# Patient Record
Sex: Female | Born: 2000 | Race: Black or African American | Hispanic: No | Marital: Single | Smoking: Never smoker
Health system: Southern US, Community
[De-identification: ages and names within clinical notes are randomized; demographics above are authoritative.]

## PROBLEM LIST (undated history)

## (undated) DIAGNOSIS — J45909 Unspecified asthma, uncomplicated: Secondary | ICD-10-CM

## (undated) HISTORY — PX: NO PAST SURGERIES: SHX2092

---
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
## 2019-11-20 ENCOUNTER — Other Ambulatory Visit: Payer: Self-pay | Admitting: Family

## 2022-11-28 ENCOUNTER — Ambulatory Visit
Admission: EM | Admit: 2022-11-28 | Discharge: 2022-11-28 | Disposition: A | Discharge status: Home / Self Care | Payer: Medicaid Other | Attending: Internal Medicine | Admitting: Internal Medicine

## 2022-11-28 DIAGNOSIS — L720 Epidermal cyst: Secondary | ICD-10-CM

## 2022-11-28 MED ORDER — IBUPROFEN 800 MG PO TABS: 800.0000 mg | ORAL_TABLET | Freq: Three times a day (TID) | ORAL | 0 refills | Status: DC | PRN

## 2022-11-28 MED ORDER — DOXYCYCLINE HYCLATE 100 MG PO CAPS: 100.0000 mg | ORAL_CAPSULE | Freq: Two times a day (BID) | ORAL | 0 refills | Status: AC

## 2022-11-28 NOTE — ED Triage Notes (Signed)
Pt with bump on right side of face since about April-May of this year, larger in the last month and Last Friday pt was seen at ED in hospital dx epidermoid cyst booked appt with Dermatology today but the provider was not in and adv her to go to UC.  Denies: F/chills or drainage.

## 2022-11-28 NOTE — Discharge Instructions (Signed)
I recommend you follow up with dermatology or general surgery regarding removing the cyst. In the meantime, I have sent a prescription for ibuprofen 800mg  to help with the pain. I have also sent a prescription for doxycycline. This is an antibiotic used to treat skin infections. This will help with any developing infection. Please take as directed.

## 2022-11-28 NOTE — ED Provider Notes (Signed)
[facility]  Note:  This document was prepared using Dragon voice recognition software and may include unintentional dictation errors.  MRN: [id number] DOB: August 20, 2000 DATE: 11/28/22   Subjective:  Chief Complaint:  Chief Complaint  Patient presents with   Mass    HPI: Amy Kane is a 22 y.o. female presenting for mass on the right side of her face for the past 5 months.  Patient states she has had a mass on the right side of her face since April or May of this year, but has become larger within the last month.  She states she went to the ER on Friday and was diagnosed with an epidermoid cyst.  She was instructed to follow-up with dermatology to have the cyst removed.  She states she had an appointment today, but as she was waiting to be seen she was informed that the doctor had left for the day.  She is concerned because she feels that the area is getting more painful the more that it is palpated.  She states she has not take anything for the pain. Denies fever, nausea/vomiting, discharge. Endorses mass. Presents NAD.  Prior to Admission medications   Medication Sig Start Date End Date Taking? Authorizing Provider  doxycycline (VIBRAMYCIN) 100 MG capsule Take 1 capsule (100 mg total) by mouth 2 (two) times daily for 7 days. 11/28/22 12/05/22 Yes Terrius Gentile P, PA-C  ibuprofen (ADVIL) 800 MG tablet Take 1 tablet (800 mg total) by mouth every 8 (eight) hours as needed. 11/28/22  Yes Chiron Campione P, PA-C     No Known Allergies  History:   Past Medical History:  Diagnosis Date   Asthma      History reviewed. No pertinent surgical history.  History reviewed. No pertinent family history.  Social History   Tobacco Use   Smoking status: Never   Smokeless tobacco: Never    Review of Systems  Constitutional:  Negative for fever.  Gastrointestinal:  Negative for nausea and vomiting.  Skin:        Skin lesion/mass     Objective:   Vitals: BP 104/68 (BP  Location: Right Arm)   Pulse 78   Temp 98.7 F (37.1 C) (Oral)   Resp 18   LMP 11/05/2022 (Approximate)   SpO2 98%   Physical Exam Constitutional:      General: She is not in acute distress.    Appearance: Normal appearance. She is well-developed and normal weight. She is not ill-appearing or toxic-appearing.  HENT:     Head: Normocephalic and atraumatic. Mass present.     Comments: Patient has cyst approximately 2 cm in size on the right side of her face anterior to her right ear.  Tenderness to palpation.  No warmth, erythema, discharge.  No overlying erythema.    Left Ear: There is impacted cerumen.  Cardiovascular:     Rate and Rhythm: Normal rate and regular rhythm.     Heart sounds: Normal heart sounds.  Pulmonary:     Effort: Pulmonary effort is normal.     Breath sounds: Normal breath sounds.     Comments: Clear to auscultation bilaterally  Abdominal:     General: Bowel sounds are normal.     Palpations: Abdomen is soft.     Tenderness: There is no abdominal tenderness.  Skin:    General: Skin is warm and dry.     Findings: Lesion present.  Neurological:     General: No focal deficit present.  Mental Status: She is alert.  Psychiatric:        Mood and Affect: Mood and affect normal.     Results:  Labs: No results found for this or any previous visit (from the past 24 hour(s)).  Radiology: No results found.   UC Course/Treatments:  Procedures: Procedures   Medications Ordered in UC: Medications - No data to display   Assessment and Plan :     ICD-10-CM   1. Epidermoid cyst of face  L72.0      Epidermoid cyst of face Afebrile, nontoxic-appearing, NAD. VSS. DDX includes but not limited to: Cyst, abscess, parotitis, mumps Cyst is overlying right trigeminal nerve. Given location, I recommend a dermatologist or general surgeon remove the cyst. She has upcoming appointment with dermatology on 12/14/2022, but is unsure that she can wait that long. She  was provided with contact information for a general surgeon at her visit today.  In the meantime, she was given a prescription for ibuprofen 800mg  every 8 hours as needed for pain as well as a prescription for doxycycline 100 mg twice daily for possible developing cellulitis. Strict ED precautions were given and patient verbalized understanding.  ED Discharge Orders          Ordered    doxycycline (VIBRAMYCIN) 100 MG capsule  2 times daily        11/28/22 1644    ibuprofen (ADVIL) 800 MG tablet  Every 8 hours PRN        11/28/22 1644             PDMP not reviewed this encounter.       Cynda Ac[place], PA-C 11/28/22 1733

## 2023-08-20 ENCOUNTER — Ambulatory Visit
Admission: EM | Admit: 2023-08-20 | Discharge: 2023-08-20 | Disposition: A | Discharge status: Home / Self Care | Attending: Emergency Medicine | Admitting: Emergency Medicine

## 2023-08-20 ENCOUNTER — Other Ambulatory Visit: Payer: Self-pay

## 2023-08-20 DIAGNOSIS — K047 Periapical abscess without sinus: Secondary | ICD-10-CM | POA: Diagnosis not present

## 2023-08-20 DIAGNOSIS — S025XXB Fracture of tooth (traumatic), initial encounter for open fracture: Secondary | ICD-10-CM

## 2023-08-20 MED ORDER — IBUPROFEN 600 MG PO TABS: 600.0000 mg | ORAL_TABLET | Freq: Three times a day (TID) | ORAL | 0 refills | Status: DC | PRN

## 2023-08-20 MED ORDER — PENICILLIN V POTASSIUM 500 MG PO TABS: 500.0000 mg | ORAL_TABLET | Freq: Three times a day (TID) | ORAL | 0 refills | Status: AC

## 2023-08-20 NOTE — ED Provider Notes (Signed)
 [facility]    CSN: [id number] Arrival date & time: 08/20/23  1343    HISTORY   Chief Complaint  Patient presents with   Dental Pain   HPI Amy Kane is a pleasant, 22 y.o. female who presents to urgent care today. Patient complains of breaking a left upper molar while eating 2 days ago.  Patient states the tooth has been very painful and she has been avoiding chewing on that side.  Patient states she has also been gargling with hydrogen peroxide.  Patient states that today she noticed swelling and tenderness on the left side of her face near the broken tooth.  Patient states she has already scheduled an appointment with a dentist but wanted to be seen about the swelling in her face.  The history is provided by the patient.  Dental Pain  Past Medical History:  Diagnosis Date   Asthma    There are no active problems to display for this patient.  History reviewed. No pertinent surgical history. OB History   No obstetric history on file.    Home Medications    Prior to Admission medications   Medication Sig Start Date End Date Taking? Authorizing Provider  ibuprofen  (ADVIL ) 800 MG tablet Take 1 tablet (800 mg total) by mouth every 8 (eight) hours as needed. 11/28/22   Hermanns, Ashlee P, PA-C    Family History No family history on file. Social History Social History   Tobacco Use   Smoking status: Never   Smokeless tobacco: Never  Substance Use Topics   Alcohol use: Not Currently   Drug use: Yes    Types: Marijuana    Comment: daily   Allergies   Patient has no known allergies.  Review of Systems Review of Systems Pertinent findings revealed after performing a 14 point review of systems has been noted in the history of present illness.  Physical Exam Vital Signs BP 112/73 (BP Location: Right Arm)   Pulse 76   Temp 98.9 F (37.2 C) (Oral)   Resp 17   LMP 07/22/2023   SpO2 98%   No data found.  Physical Exam Vitals and nursing note  reviewed.  Constitutional:      General: She is awake. She is not in acute distress.    Appearance: Normal appearance. She is well-developed and well-groomed.  HENT:     Head:      Mouth/Throat:     Lips: Pink.     Mouth: Mucous membranes are moist.     Dentition: Gingival swelling present. No dental caries.     Tongue: No lesions. Tongue does not deviate from midline.     Palate: No lesions.   Neurological:     Mental Status: She is alert.  Psychiatric:        Behavior: Behavior is cooperative.     Visual Acuity Right Eye Distance:   Left Eye Distance:   Bilateral Distance:    Right Eye Near:   Left Eye Near:    Bilateral Near:     UC Couse / Diagnostics / Procedures:     Radiology No results found.  Procedures Procedures (including critical care time) EKG  Pending results:  Labs Reviewed - No data to display  Medications Ordered in UC: Medications - No data to display  UC Diagnoses / Final Clinical Impressions(s)   I have reviewed the triage vital signs and the nursing notes.  Pertinent labs & imaging results that were available during my care of  the patient were reviewed by me and considered in my medical decision making (see chart for details).    Final diagnoses:  Open fracture of tooth, initial encounter  Dental abscess   Patient advised to begin penicillin for treatment of presumed dental abscess.  Patient provided with ibuprofen  for pain and swelling.  Patient encouraged to keep her dental appointment and to return for further evaluation if penicillin and ibuprofen  have not been effective and symptoms are getting worse, not better.  Please see discharge instructions below for details of plan of care as provided to patient. ED Prescriptions     Medication Sig Dispense Auth. Provider   penicillin v potassium (VEETID) 500 MG tablet Take 1 tablet (500 mg total) by mouth 3 (three) times daily for 14 days. 42 tablet Eloise Hake Scales, PA-C    ibuprofen  (ADVIL ) 600 MG tablet Take 1 tablet (600 mg total) by mouth every 8 (eight) hours as needed for up to 30 doses for fever, headache, mild pain (pain score 1-3) or moderate pain (pain score 4-6) (Inflammation). Take 1 tablet 3 times daily as needed for inflammation of upper airways and/or pain. 30 tablet Eloise Hake Scales, PA-C      PDMP not reviewed this encounter.  Pending results:  Labs Reviewed - No data to display    Discharge Instructions      Please read below to learn more about the medications, dosages and frequencies that I recommend to help alleviate your symptoms and to get you feeling better soon:   Penicillin V:  Please take one (1) dose three times daily for 14 days.  This antibiotic can cause upset stomach, this will resolve once antibiotics are complete.  You are welcome to take a probiotic, eat yogurt, take Imodium while taking this medication.  Please avoid other systemic medications such as Maalox, Pepto-Bismol or milk of magnesia as they can interfere with the body's ability to absorb the antibiotics.   Advil , Motrin  (ibuprofen ): This is a good anti-inflammatory medication which addresses aches, pains and inflammation of the upper airways that causes sinus and nasal congestion as well as in the lower airways which makes your cough feel tight and sometimes burn.  I recommend that you take between 400 to 600 mg every 6-8 hours as needed.  Please do not take more than 2400 mg of ibuprofen  in a 24-hour period and please do not take high doses of ibuprofen  for more than 3 days in a row as this can lead to stomach ulcers.   Please be sure that you keep your appointment with your dentist.  The tooth that is broken needs to be repaired so that it will not become infected again.    If symptoms have worsened in the next 5 to 7 days, please go to the emergency room for further evaluation.    Thank you for visiting urgent care today.  We appreciate the opportunity to  participate in your care.     Disposition Upon Discharge:  Condition: stable for discharge home  Patient presented with an acute illness with associated systemic symptoms and significant discomfort requiring urgent management. In my opinion, this is a condition that a prudent lay person (someone who possesses an average knowledge of health and medicine) may potentially expect to result in complications if not addressed urgently such as respiratory distress, impairment of bodily function or dysfunction of bodily organs.   Routine symptom specific, illness specific and/or disease specific instructions were discussed with the patient and/or caregiver  at length.   As such, the patient has been evaluated and assessed, work-up was performed and treatment was provided in alignment with urgent care protocols and evidence based medicine.  Patient/parent/caregiver has been advised that the patient may require follow up for further testing and treatment if the symptoms continue in spite of treatment, as clinically indicated and appropriate.  Patient/parent/caregiver has been advised to return to the [facility] or PCP if no better; to PCP or the Emergency Department if new signs and symptoms develop, or if the current signs or symptoms continue to change or worsen for further workup, evaluation and treatment as clinically indicated and appropriate  The patient will follow up with their current PCP if and as advised. If the patient does not currently have a PCP we will assist them in obtaining one.   The patient may need specialty follow up if the symptoms continue, in spite of conservative treatment and management, for further workup, evaluation, consultation and treatment as clinically indicated and appropriate.  Patient/parent/caregiver verbalized understanding and agreement of plan as discussed.  All questions were addressed during visit.  Please see discharge instructions below for further details of  plan.  This office note has been dictated using Teaching laboratory technician.  Unfortunately, this method of dictation can sometimes lead to typographical or grammatical errors.  I apologize for your inconvenience in advance if this occurs.  Please do not hesitate to reach out to me if clarification is needed.      Eloise Hake Scales, PA-C 08/20/23 1434

## 2023-08-20 NOTE — ED Triage Notes (Signed)
 Patient states pain left upper back tooth. Patient states she broke a tooth while eating 2 days ago. Patient states swelling to left side of face.

## 2023-08-20 NOTE — Discharge Instructions (Signed)
 Please read below to learn more about the medications, dosages and frequencies that I recommend to help alleviate your symptoms and to get you feeling better soon:   Penicillin V:  Please take one (1) dose three times daily for 14 days.  This antibiotic can cause upset stomach, this will resolve once antibiotics are complete.  You are welcome to take a probiotic, eat yogurt, take Imodium while taking this medication.  Please avoid other systemic medications such as Maalox, Pepto-Bismol or milk of magnesia as they can interfere with the body's ability to absorb the antibiotics.   Advil , Motrin  (ibuprofen ): This is a good anti-inflammatory medication which addresses aches, pains and inflammation of the upper airways that causes sinus and nasal congestion as well as in the lower airways which makes your cough feel tight and sometimes burn.  I recommend that you take between 400 to 600 mg every 6-8 hours as needed.  Please do not take more than 2400 mg of ibuprofen  in a 24-hour period and please do not take high doses of ibuprofen  for more than 3 days in a row as this can lead to stomach ulcers.   Please be sure that you keep your appointment with your dentist.  The tooth that is broken needs to be repaired so that it will not become infected again.    If symptoms have worsened in the next 5 to 7 days, please go to the emergency room for further evaluation.    Thank you for visiting urgent care today.  We appreciate the opportunity to participate in your care.

## 2024-01-29 ENCOUNTER — Ambulatory Visit: Admission: EM | Admit: 2024-01-29 | Discharge: 2024-01-29 | Disposition: A | Discharge status: Home / Self Care

## 2024-01-29 DIAGNOSIS — H6123 Impacted cerumen, bilateral: Secondary | ICD-10-CM | POA: Diagnosis not present

## 2024-01-29 DIAGNOSIS — H6012 Cellulitis of left external ear: Secondary | ICD-10-CM | POA: Diagnosis not present

## 2024-01-29 MED ORDER — CEFDINIR 300 MG PO CAPS: 300.0000 mg | ORAL_CAPSULE | Freq: Two times a day (BID) | ORAL | 0 refills | Status: AC

## 2024-01-29 MED ORDER — DEBROX 6.5 % OT SOLN: 5.0000 [drp] | Freq: Two times a day (BID) | OTIC | 0 refills | Status: AC

## 2024-01-29 NOTE — ED Triage Notes (Signed)
 Patient states about two days ago she started having left otalgia. Today she has been having muffled hearing. She states today she has had a slight headache and numbness in the surrounding area of her ear. She took Ibuprofen  yesterday and this morning which did help.

## 2024-01-29 NOTE — Discharge Instructions (Signed)
 For treatment of infection in the walls of the canal of your left ear, please begin taking cefdinir.  For management of the wax buildup in both of your ear canals, I would like for you to instill 5 drops of Debrox in each ear twice daily.  In the next 5 to 7 days, please return to have your ears irrigated so that the wax can be removed and we can fully evaluate your left ear canal and eardrum.  You are welcome to continue taking ibuprofen  as needed for pain.  Please do not exceed 2400 mg of ibuprofen  in a 24-hour period.  Thank you for visiting [facility] today.

## 2024-01-29 NOTE — ED Provider Notes (Signed)
 [facility]    CSN: [id number] Arrival date & time: 01/29/24  1641    HISTORY   Chief Complaint  Patient presents with   Otalgia   HPI Amy Kane is a pleasant, 23 y.o. female who presents to urgent care today. Patient complains of a 2-day history of gradually worsening left ear pain.  Patient states today, she noticed that her hearing was muffled.  Patient also states that she began to have a slight headache and numbness around her left ear as well.  States she took ibuprofen  yesterday and again this morning, states this relieved pain temporarily.  Patient reports a history of always having a lot of earwax.  Patient states she tries not to use Q-tips to clean out her ears but did try yesterday.  Patient states she also washed her hair 2 days ago and believes that she got water in her ear while doing so.  Patient denies nasal congestion, sinus pain or pressure, rhinorrhea, fever, body aches, chills, sore throat.  The history is provided by the patient.  Otalgia  Past Medical History:  Diagnosis Date   Asthma    Patient Active Problem List   Diagnosis Date Noted   Asthma 05/20/2013   Past Surgical History:  Procedure Laterality Date   NO PAST SURGERIES     OB History     Gravida  1   Para      Term      Preterm      AB  1   Living         SAB  1   IAB      Ectopic      Multiple      Live Births             Home Medications    Prior to Admission medications   Medication Sig Start Date End Date Taking? Authorizing Provider  albuterol (VENTOLIN HFA) 108 (90 Base) MCG/ACT inhaler Inhale 2 puffs into the lungs. 12/25/19  Yes [provider]  doxycycline  (VIBRAMYCIN ) 100 MG capsule Take 100 mg by mouth. 11/28/22  Yes [provider]  medroxyPROGESTERone (DEPO-PROVERA) 150 MG/ML injection Inject 150 mg into the muscle. 01/04/20  Yes [provider]  neomycin-polymyxin b-dexamethasone (MAXITROL) 3.5-10000-0.1 SUSP  SMARTSIG:In Eye(s) 01/28/24  Yes [provider]  ibuprofen  (ADVIL ) 600 MG tablet Take 1 tablet (600 mg total) by mouth every 8 (eight) hours as needed for up to 30 doses for fever, headache, mild pain (pain score 1-3) or moderate pain (pain score 4-6) (Inflammation). Take 1 tablet 3 times daily as needed for inflammation of upper airways and/or pain. 08/20/23   Joesph Shaver Scales, PA-C    Family History History reviewed. No pertinent family history. Social History Social History   Tobacco Use   Smoking status: Never    Passive exposure: Past   Smokeless tobacco: Never  Vaping Use   Vaping status: Never Used  Substance Use Topics   Alcohol use: Not Currently   Drug use: Yes    Types: Marijuana    Comment: daily   Allergies   Patient has no known allergies.  Review of Systems Review of Systems  HENT:  Positive for ear pain.    Pertinent findings revealed after performing a 14 point review of systems has been noted in the history of present illness.  Physical Exam Vital Signs BP 112/72 (BP Location: Right Arm)   Pulse 84   Temp 98.9 F (37.2 C) (Oral)  Resp 18   LMP 01/19/2024   SpO2 98%   No data found.  Physical Exam Vitals and nursing note reviewed.  Constitutional:      General: She is awake. She is not in acute distress.    Appearance: Normal appearance. She is well-developed and well-groomed. She is not ill-appearing.  HENT:     Head: Normocephalic and atraumatic.     Salivary Glands: Right salivary gland is not diffusely enlarged or tender. Left salivary gland is not diffusely enlarged or tender.     Right Ear: Hearing and external ear normal. No decreased hearing noted. There is impacted cerumen. No mastoid tenderness.     Left Ear: External ear normal. Decreased hearing noted. There is impacted cerumen. No mastoid tenderness.     Ears:      Comments: Antrum of left EAC is mildly erythematous and edematous    Nose: Nose normal. No rhinorrhea.      Right Turbinates: Enlarged, swollen and pale.     Left Turbinates: Enlarged, swollen and pale.     Right Sinus: No maxillary sinus tenderness or frontal sinus tenderness.     Left Sinus: No maxillary sinus tenderness or frontal sinus tenderness.     Mouth/Throat:     Lips: Pink. No lesions.     Mouth: Mucous membranes are moist. No oral lesions.     Tongue: No lesions. Tongue does not deviate from midline.     Palate: No mass and lesions.     Pharynx: Oropharynx is clear. Uvula midline. No pharyngeal swelling, oropharyngeal exudate, posterior oropharyngeal erythema, uvula swelling or postnasal drip.     Tonsils: No tonsillar exudate. 0 on the right. 0 on the left.  Eyes:     General: Lids are normal.        Right eye: No discharge.        Left eye: No discharge.     Conjunctiva/sclera: Conjunctivae normal.     Right eye: Right conjunctiva is not injected.     Left eye: Left conjunctiva is not injected.  Neck:     Trachea: Trachea and phonation normal.  Cardiovascular:     Rate and Rhythm: Normal rate and regular rhythm.  Pulmonary:     Effort: Pulmonary effort is normal.     Breath sounds: Normal breath sounds.  Chest:     Chest wall: No tenderness.  Musculoskeletal:        General: Normal range of motion.     Cervical back: Full passive range of motion without pain, normal range of motion and neck supple. Normal range of motion.  Lymphadenopathy:     Cervical: No cervical adenopathy.  Skin:    General: Skin is warm and dry.     Findings: No erythema or rash.  Neurological:     General: No focal deficit present.     Mental Status: She is alert and oriented to person, place, and time. Mental status is at baseline.  Psychiatric:        Attention and Perception: Attention and perception normal.        Mood and Affect: Mood and affect normal.        Speech: Speech normal.        Behavior: Behavior normal. Behavior is cooperative.        Thought Content: Thought content  normal.     Visual Acuity Right Eye Distance:   Left Eye Distance:   Bilateral Distance:    Right Eye Near:  Left Eye Near:    Bilateral Near:     UC Couse / Diagnostics / Procedures:     Radiology No results found.  Procedures Ear Cerumen Removal  Date/Time: 01/29/2024 5:11 PM  Performed by: Joesph Shaver Scales, PA-C Authorized by: Joesph Shaver Scales, PA-C   Consent:    Consent obtained:  Verbal   Consent given by:  Patient   Risks, benefits, and alternatives were discussed: yes     Risks discussed:  Dizziness, bleeding, infection, incomplete removal, TM perforation and pain   Alternatives discussed:  No treatment, delayed treatment, alternative treatment, observation and referral Universal protocol:    Procedure explained and questions answered to patient or proxy's satisfaction: yes     Patient identity confirmed:  Verbally with patient and arm band Procedure details:    Location:  R ear   Procedure type: irrigation     Procedure outcomes: unable to remove cerumen   Post-procedure details:    Procedure completion:  Procedure terminated at patient's request (Pt terminated irrigation of right EAC then politely refused irrigation of left EAC)  (including critical care time) EKG  Pending results:  Labs Reviewed - No data to display  Medications Ordered in UC: Medications - No data to display  UC Diagnoses / Final Clinical Impressions(s)   I have reviewed the triage vital signs and the nursing notes.  Pertinent labs & imaging results that were available during my care of the patient were reviewed by me and considered in my medical decision making (see chart for details).    Final diagnoses:  Bilateral impacted cerumen  Cellulitis of left ear canal   Patient was unable to tolerate irrigation of either ear.  Based on limited physical and findings, the best course of action for now is to treat patient empirically for presumed cellulitis in left EAC while  patient uses Debrox in both ears twice daily to soften earwax and make it more amenable to removal.  Patient encouraged to return in 5 to 7 days for ear irrigation so that a more thorough evaluation of both ears can be performed.  Patient advised to return sooner if no improvement.  Please see discharge instructions below for details of plan of care as provided to patient. ED Prescriptions     Medication Sig Dispense Auth. Provider   carbamide peroxide (DEBROX) 6.5 % OTIC solution Place 5 drops into both ears 2 (two) times daily for 7 days. 3.5 mL Joesph Shaver Scales, PA-C   cefdinir (OMNICEF) 300 MG capsule Take 1 capsule (300 mg total) by mouth 2 (two) times daily for 10 days. 20 capsule Joesph Shaver Scales, PA-C      PDMP not reviewed this encounter.  Pending results:  Labs Reviewed - No data to display    Discharge Instructions      For treatment of infection in the walls of the canal of your left ear, please begin taking cefdinir.  For management of the wax buildup in both of your ear canals, I would like for you to instill 5 drops of Debrox in each ear twice daily.  In the next 5 to 7 days, please return to have your ears irrigated so that the wax can be removed and we can fully evaluate your left ear canal and eardrum.  You are welcome to continue taking ibuprofen  as needed for pain.  Please do not exceed 2400 mg of ibuprofen  in a 24-hour period.  Thank you for visiting [facility] today.  Disposition Upon Discharge:  Condition: stable for discharge home  Patient presented with an acute illness with associated systemic symptoms and significant discomfort requiring urgent management. In my opinion, this is a condition that a prudent lay person (someone who possesses an average knowledge of health and medicine) may potentially expect to result in complications if not addressed urgently such as respiratory distress, impairment of bodily function or  dysfunction of bodily organs.   Routine symptom specific, illness specific and/or disease specific instructions were discussed with the patient and/or caregiver at length.   As such, the patient has been evaluated and assessed, work-up was performed and treatment was provided in alignment with urgent care protocols and evidence based medicine.  Patient/parent/caregiver has been advised that the patient may require follow up for further testing and treatment if the symptoms continue in spite of treatment, as clinically indicated and appropriate.  Patient/parent/caregiver has been advised to return to the [facility] or PCP if no better; to PCP or the Emergency Department if new signs and symptoms develop, or if the current signs or symptoms continue to change or worsen for further workup, evaluation and treatment as clinically indicated and appropriate  The patient will follow up with their current PCP if and as advised. If the patient does not currently have a PCP we will assist them in obtaining one.   The patient may need specialty follow up if the symptoms continue, in spite of conservative treatment and management, for further workup, evaluation, consultation and treatment as clinically indicated and appropriate.  Patient/parent/caregiver verbalized understanding and agreement of plan as discussed.  All questions were addressed during visit.  Please see discharge instructions below for further details of plan.  This office note has been dictated using Teaching laboratory technician.  Unfortunately, this method of dictation can sometimes lead to typographical or grammatical errors.  I apologize for your inconvenience in advance if this occurs.  Please do not hesitate to reach out to me if clarification is needed.      Joesph Shaver Scales, PA-C 01/29/24 1735
